# Patient Record
Sex: Male | Born: 1970 | Race: White | Hispanic: No | Marital: Married | Smoking: Never smoker
Health system: Southern US, Community
[De-identification: ages and names within clinical notes are randomized; demographics above are authoritative.]

## PROBLEM LIST (undated history)

## (undated) DIAGNOSIS — N200 Calculus of kidney: Secondary | ICD-10-CM

## (undated) DIAGNOSIS — R7989 Other specified abnormal findings of blood chemistry: Secondary | ICD-10-CM

## (undated) DIAGNOSIS — R911 Solitary pulmonary nodule: Secondary | ICD-10-CM

## (undated) HISTORY — DX: Solitary pulmonary nodule: R91.1

## (undated) HISTORY — DX: Calculus of kidney: N20.0

## (undated) HISTORY — DX: Other specified abnormal findings of blood chemistry: R79.89

## (undated) HISTORY — PX: OTHER SURGICAL HISTORY: SHX169

---

## 1999-04-01 ENCOUNTER — Emergency Department (HOSPITAL_COMMUNITY): Admission: EM | Admit: 1999-04-01 | Discharge: 1999-04-01 | Payer: Self-pay | Admitting: Emergency Medicine

## 1999-04-01 ENCOUNTER — Encounter: Payer: Self-pay | Admitting: Emergency Medicine

## 1999-04-06 ENCOUNTER — Emergency Department (HOSPITAL_COMMUNITY): Admission: EM | Admit: 1999-04-06 | Discharge: 1999-04-06 | Payer: Self-pay | Admitting: Emergency Medicine

## 1999-04-06 ENCOUNTER — Encounter: Payer: Self-pay | Admitting: Emergency Medicine

## 2004-07-15 ENCOUNTER — Encounter: Admission: RE | Admit: 2004-07-15 | Discharge: 2004-07-15 | Payer: Self-pay | Admitting: Family Medicine

## 2009-12-17 ENCOUNTER — Ambulatory Visit (HOSPITAL_BASED_OUTPATIENT_CLINIC_OR_DEPARTMENT_OTHER): Admission: RE | Admit: 2009-12-17 | Discharge: 2009-12-17 | Payer: Self-pay | Admitting: Family Medicine

## 2009-12-17 ENCOUNTER — Ambulatory Visit: Payer: Self-pay | Admitting: Radiology

## 2010-03-16 ENCOUNTER — Ambulatory Visit: Payer: Self-pay | Admitting: Radiology

## 2010-03-16 ENCOUNTER — Emergency Department (HOSPITAL_BASED_OUTPATIENT_CLINIC_OR_DEPARTMENT_OTHER): Admission: EM | Admit: 2010-03-16 | Discharge: 2010-03-16 | Payer: Self-pay | Admitting: Emergency Medicine

## 2010-10-07 ENCOUNTER — Emergency Department (HOSPITAL_BASED_OUTPATIENT_CLINIC_OR_DEPARTMENT_OTHER)
Admission: EM | Admit: 2010-10-07 | Discharge: 2010-10-07 | Disposition: A | Discharge status: Home / Self Care | Payer: Managed Care, Other (non HMO) | Attending: Emergency Medicine | Admitting: Emergency Medicine

## 2010-10-07 ENCOUNTER — Emergency Department (INDEPENDENT_AMBULATORY_CARE_PROVIDER_SITE_OTHER): Payer: Managed Care, Other (non HMO)

## 2010-10-07 DIAGNOSIS — K219 Gastro-esophageal reflux disease without esophagitis: Secondary | ICD-10-CM | POA: Insufficient documentation

## 2010-10-07 DIAGNOSIS — R109 Unspecified abdominal pain: Secondary | ICD-10-CM

## 2010-10-07 DIAGNOSIS — R55 Syncope and collapse: Secondary | ICD-10-CM | POA: Insufficient documentation

## 2010-10-07 DIAGNOSIS — J45909 Unspecified asthma, uncomplicated: Secondary | ICD-10-CM | POA: Insufficient documentation

## 2010-10-07 DIAGNOSIS — N23 Unspecified renal colic: Secondary | ICD-10-CM | POA: Insufficient documentation

## 2010-10-07 LAB — CBC
HCT: 42.4 % (ref 39.0–52.0)
Hemoglobin: 14.5 g/dL (ref 13.0–17.0)
MCH: 28.3 pg (ref 26.0–34.0)
MCHC: 34.2 g/dL (ref 30.0–36.0)
MCV: 82.7 fL (ref 78.0–100.0)
Platelets: 200 10*3/uL (ref 150–400)
RBC: 5.13 MIL/uL (ref 4.22–5.81)
RDW: 13 % (ref 11.5–15.5)
WBC: 8 K/uL (ref 4.0–10.5)

## 2010-10-07 LAB — DIFFERENTIAL
Basophils Absolute: 0 10*3/uL (ref 0.0–0.1)
Basophils Relative: 0 % (ref 0–1)
Eosinophils Absolute: 0.1 K/uL (ref 0.0–0.7)
Eosinophils Relative: 1 % (ref 0–5)
Lymphocytes Relative: 17 % (ref 12–46)
Lymphs Abs: 1.3 K/uL (ref 0.7–4.0)
Monocytes Absolute: 0.6 10*3/uL (ref 0.1–1.0)
Monocytes Relative: 8 % (ref 3–12)
Neutro Abs: 5.9 10*3/uL (ref 1.7–7.7)
Neutrophils Relative %: 74 % (ref 43–77)

## 2010-10-07 LAB — COMPREHENSIVE METABOLIC PANEL WITH GFR
AST: 25 U/L (ref 0–37)
Albumin: 4.4 g/dL (ref 3.5–5.2)
CO2: 26 meq/L (ref 19–32)
GFR calc Af Amer: >60 mL/min (ref >60)
GFR calc non Af Amer: >60 mL/min (ref >60)
Potassium: 4 meq/L (ref 3.5–5.1)
Sodium: 144 meq/L (ref 135–145)
Total Bilirubin: 0.7 mg/dL (ref 0.3–1.2)

## 2010-10-07 LAB — COMPREHENSIVE METABOLIC PANEL
ALT: 21 U/L (ref 0–53)
Alkaline Phosphatase: 81 U/L (ref 39–117)
BUN: 15 mg/dL (ref 6–23)
Calcium: 9.5 mg/dL (ref 8.4–10.5)
Chloride: 107 mEq/L (ref 96–112)
Creatinine, Ser: 0.9 mg/dL (ref 0.4–1.5)
Glucose, Bld: 98 mg/dL (ref 70–99)
Total Protein: 7.7 g/dL (ref 6.0–8.3)

## 2010-10-07 LAB — GLUCOSE, CAPILLARY: Glucose-Capillary: 111 mg/dL — ABNORMAL HIGH (ref 70–99)

## 2010-10-07 LAB — URINALYSIS, ROUTINE W REFLEX MICROSCOPIC
Bilirubin Urine: NEGATIVE
Ketones, ur: NEGATIVE mg/dL
Leukocytes, UA: NEGATIVE
Nitrite: NEGATIVE
Protein, ur: NEGATIVE mg/dL
Specific Gravity, Urine: 1.019 (ref 1.005–1.030)
Urine Glucose, Fasting: NEGATIVE mg/dL
Urobilinogen, UA: 0.2 mg/dL (ref 0.0–1.0)
pH: 6.5 (ref 5.0–8.0)

## 2010-10-07 LAB — LIPASE, BLOOD: Lipase: 37 U/L (ref 23–300)

## 2010-10-07 LAB — D-DIMER, QUANTITATIVE: D-Dimer, Quant: <0.22 ug{FEU}/mL (ref 0.00–0.48)

## 2010-10-07 LAB — URINE MICROSCOPIC-ADD ON

## 2010-10-23 LAB — URINALYSIS, ROUTINE W REFLEX MICROSCOPIC
Protein, ur: NEGATIVE mg/dL
Specific Gravity, Urine: 1.029 (ref 1.005–1.030)

## 2010-10-23 LAB — CBC
HCT: 47.1 % (ref 39.0–52.0)
MCH: 29.3 pg (ref 26.0–34.0)
MCHC: 33.6 g/dL (ref 30.0–36.0)
Platelets: 216 10*3/uL (ref 150–400)
RDW: 12.6 % (ref 11.5–15.5)
WBC: 7.5 10*3/uL (ref 4.0–10.5)

## 2010-10-23 LAB — URINE CULTURE
Colony Count: NO GROWTH
Culture  Setup Time: 201108081913
Culture: NO GROWTH

## 2010-10-23 LAB — DIFFERENTIAL
Basophils Relative: 0 % (ref 0–1)
Eosinophils Absolute: 0.2 10*3/uL (ref 0.0–0.7)
Eosinophils Relative: 2 % (ref 0–5)
Lymphs Abs: 2.3 10*3/uL (ref 0.7–4.0)
Neutro Abs: 4.5 10*3/uL (ref 1.7–7.7)

## 2010-10-23 LAB — BASIC METABOLIC PANEL
GFR calc Af Amer: >60 mL/min (ref >60)
GFR calc non Af Amer: >60 mL/min (ref >60)
Glucose, Bld: 138 mg/dL — ABNORMAL HIGH (ref 70–99)
Sodium: 143 mEq/L (ref 135–145)

## 2010-10-23 LAB — URINE MICROSCOPIC-ADD ON

## 2010-11-17 ENCOUNTER — Other Ambulatory Visit (HOSPITAL_COMMUNITY): Payer: Self-pay | Admitting: Family Medicine

## 2010-11-17 DIAGNOSIS — N200 Calculus of kidney: Secondary | ICD-10-CM

## 2010-11-18 ENCOUNTER — Ambulatory Visit (HOSPITAL_COMMUNITY)
Admission: RE | Admit: 2010-11-18 | Discharge: 2010-11-18 | Disposition: A | Discharge status: Home / Self Care | Payer: Managed Care, Other (non HMO) | Source: Ambulatory Visit | Attending: Family Medicine | Admitting: Family Medicine

## 2010-11-18 DIAGNOSIS — R109 Unspecified abdominal pain: Secondary | ICD-10-CM | POA: Insufficient documentation

## 2010-11-18 DIAGNOSIS — N200 Calculus of kidney: Secondary | ICD-10-CM

## 2010-11-18 DIAGNOSIS — N201 Calculus of ureter: Secondary | ICD-10-CM | POA: Insufficient documentation

## 2011-08-11 IMAGING — CR DG CHEST 2V
2 series · 2 of 2 positions shown · non-contrast
Comparison: None.

CLINICAL DATA: Left flank pain, syncope, asthma

CHEST - 2 VIEW

[w chest pa]
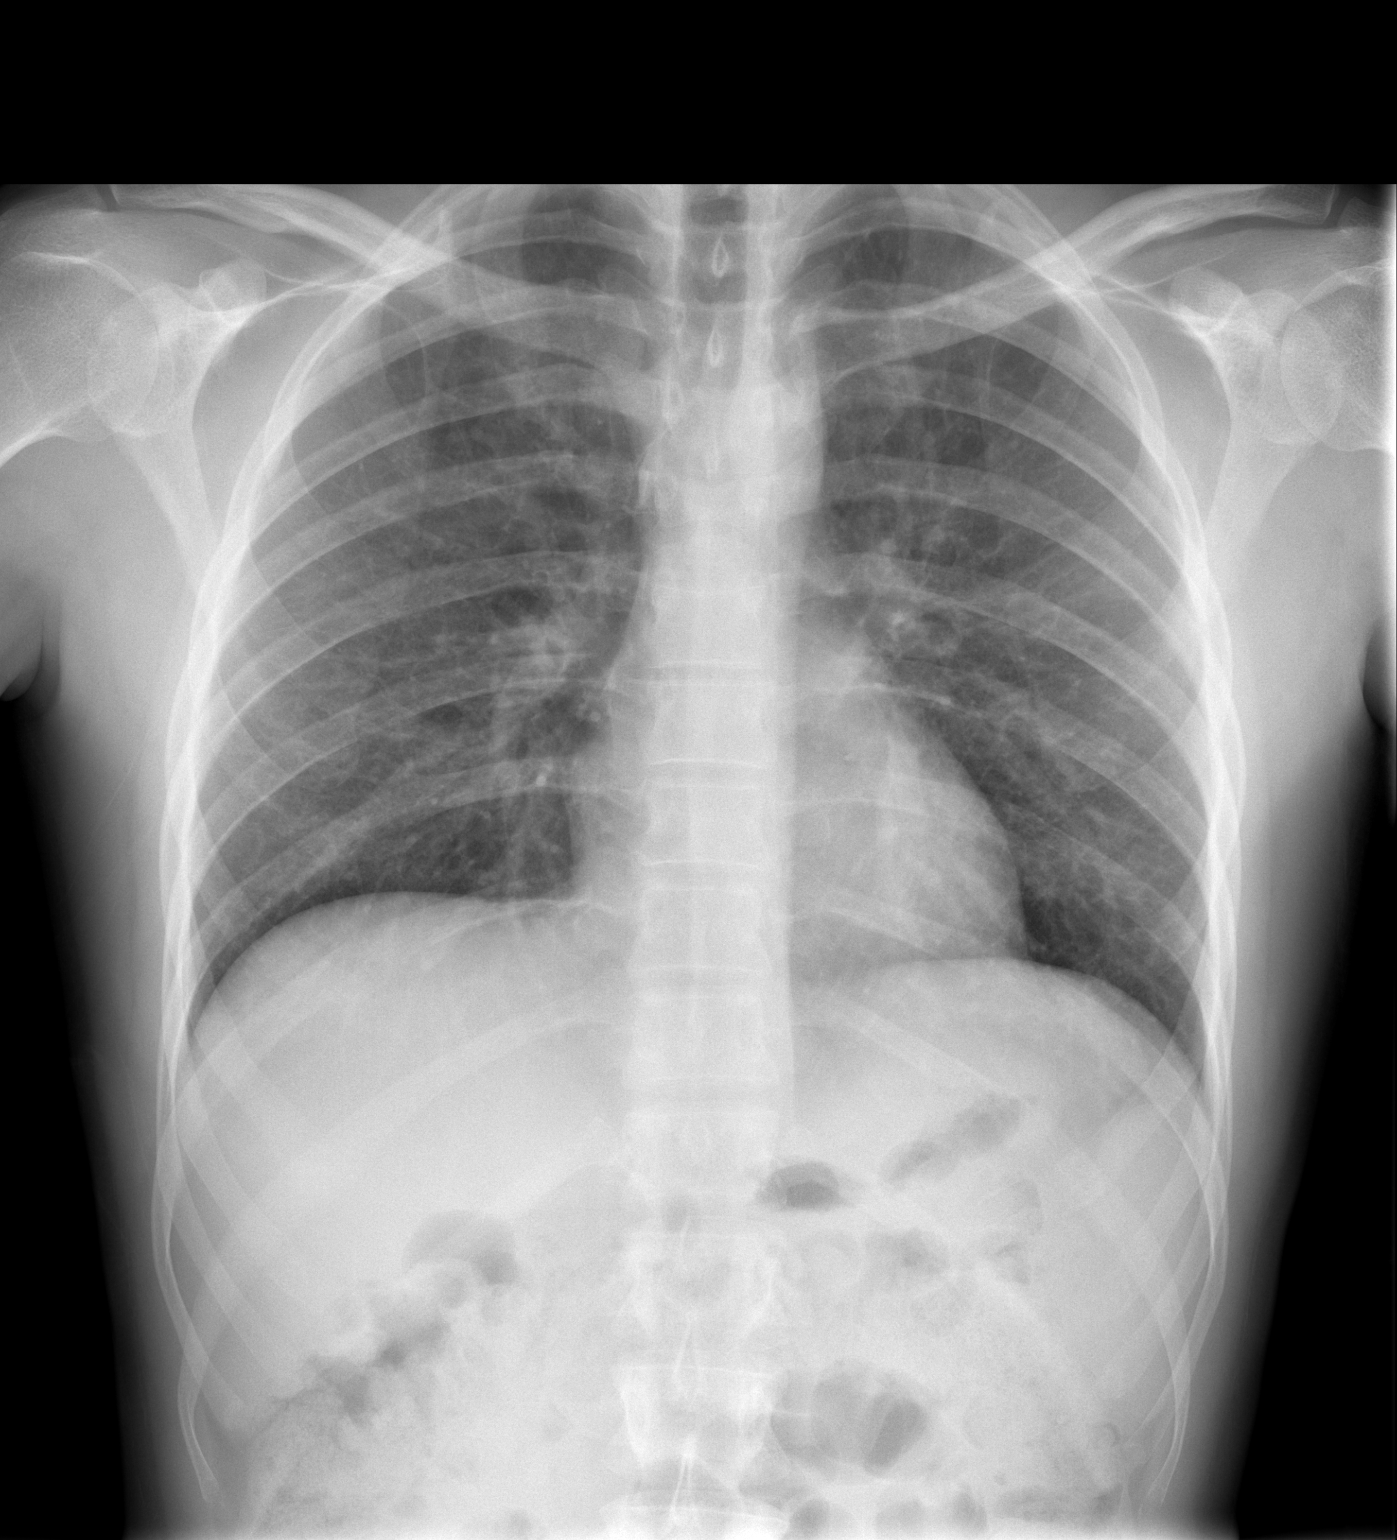

[w chest lat]
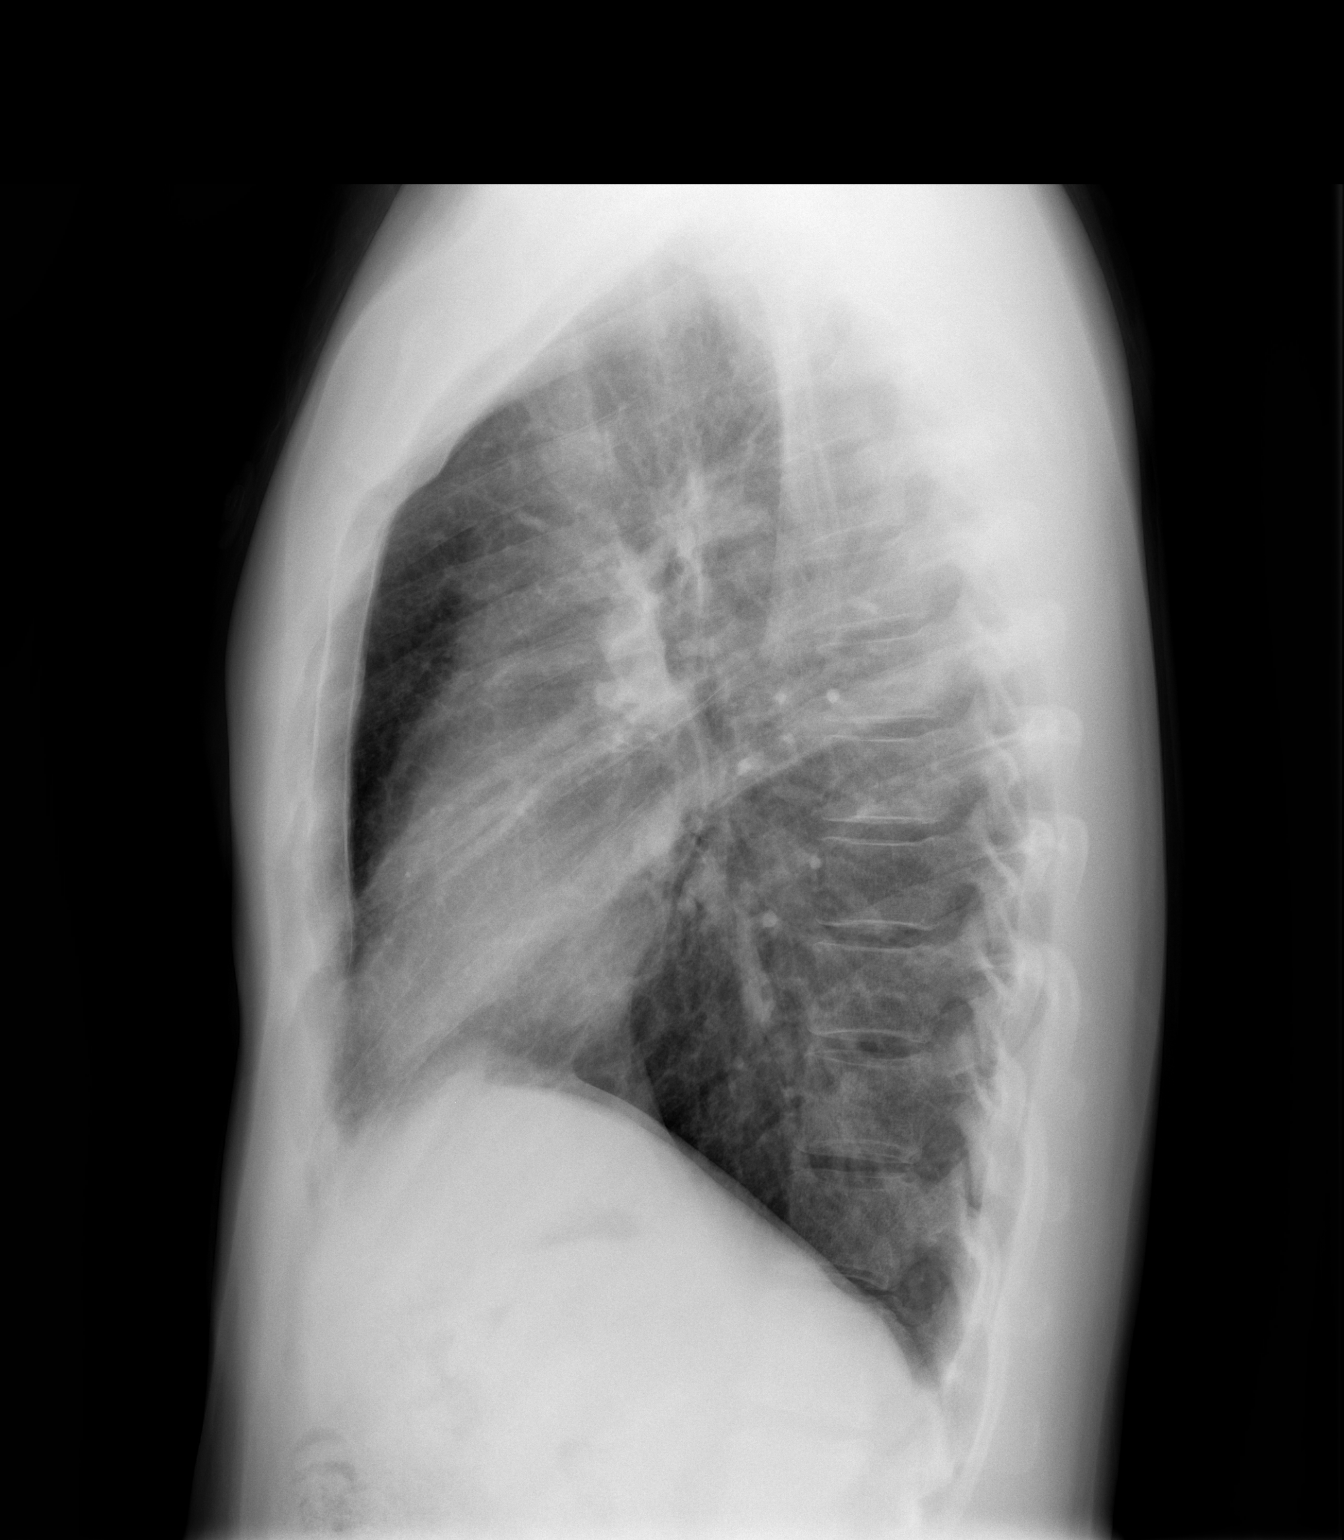

[2 of 2 positions shown; findings below may reference images not displayed]

FINDINGS: The lungs are clear.  Mediastinal contours appear normal.
The heart is within normal limits in size.  No bony abnormality is
seen.
IMPRESSION: No active lung disease.

## 2017-07-08 ENCOUNTER — Ambulatory Visit (HOSPITAL_COMMUNITY)
Admission: RE | Admit: 2017-07-08 | Discharge: 2017-07-08 | Disposition: A | Discharge status: Home / Self Care | Payer: 59 | Attending: Psychiatry | Admitting: Psychiatry

## 2017-07-08 DIAGNOSIS — E349 Endocrine disorder, unspecified: Secondary | ICD-10-CM | POA: Diagnosis not present

## 2017-07-08 DIAGNOSIS — F322 Major depressive disorder, single episode, severe without psychotic features: Secondary | ICD-10-CM | POA: Insufficient documentation

## 2017-07-08 DIAGNOSIS — Z5181 Encounter for therapeutic drug level monitoring: Secondary | ICD-10-CM | POA: Diagnosis not present

## 2017-07-08 DIAGNOSIS — Z13220 Encounter for screening for lipoid disorders: Secondary | ICD-10-CM | POA: Diagnosis not present

## 2017-07-08 DIAGNOSIS — Z130 Encounter for screening for diseases of the blood and blood-forming organs and certain disorders involving the immune mechanism: Secondary | ICD-10-CM | POA: Diagnosis not present

## 2017-07-08 NOTE — H&P (Signed)
Behavioral Health Medical Screening Exam  Caleb ArrowBryan S Cobb is an 46 y.o. male patient presents as walk-in with complaints of "feeling like I may be going through midlife crisis or depression."  Patient states that he has been having feeling of whether he needs to continue his marriage or if it would be better if he leaves.  Patient denies suicidal/homicidal/self-harm ideation, psychosis, and paranoia.  Patient seeking information and resources.     Total Time spent with patient: 45 minutes  Psychiatric Specialty Exam: Physical Exam  ROS  There were no vitals taken for this visit.There is no height or weight on file to calculate BMI.  General Appearance: Casual and Neat  Eye Contact:  Good  Speech:  Clear and Coherent and Normal Rate  Volume:  Normal  Mood:  Anxious  Affect:  Appropriate  Thought Process:  Coherent and Goal Directed  Orientation:  Full (Time, Place, and Person)  Thought Content:  Logical  Suicidal Thoughts:  No  Homicidal Thoughts:  No  Memory:  Immediate;   Good Recent;   Good Remote;   Good  Judgement:  Intact  Insight:  Present  Psychomotor Activity:  Normal  Concentration: Concentration: Good and Attention Span: Good  Recall:  Good  Fund of Knowledge:Good  Language: Good  Akathisia:  No  Handed:  Right  AIMS (if indicated):     Assets:  Communication Skills Desire for Improvement Financial Resources/Insurance Housing Intimacy Physical Health Resilience Social Support Transportation  Sleep:       Musculoskeletal: Strength & Muscle Tone: within normal limits Gait & Station: normal Patient leans: N/A  There were no vitals taken for this visit.  Recommendations:  Give resource information for marriage and individual counseling.    Disposition: No evidence of imminent risk to self or others at present.   Patient does not meet criteria for psychiatric inpatient admission.     Based on my evaluation the patient does not appear to have an  emergency medical condition.  Saddie Sandeen, NP 07/08/2017, 3:19 PM

## 2017-07-08 NOTE — BH Assessment (Signed)
Assessment Note  Caleb Cobb is an 46 y.o. male present unaccompanied to [facility] as a walk-in referred by his doctor office ([facility])  after a scheduled appointment earlier today. Patient report since the end of September his depression has been gradually increasing. Patient state, "I feel like I am going through a mid-life crisis or situational depression." Report depressive symptoms such as decreased sleep, decreased appetite, confusion, inability to think logically, clouded judgment and negative intrusive passive thoughts. Report he has been married for 23 years. Throughout his marriage has been dealing with situations  which have recently started to make him depressed. "I used to be able to handle situations and know what to do, recently I am confused and do not know how to handle things." Patient did not go into detail concerning his marital complications, only reporting he has a jealous wife. Patient report inductiveness relating to marriage. Went to the doctor for a prescription for an anti-depressant. Report, "I feel if I can think clearer then I will be able to make better decisions." Patient was referred to [facility] for an assessment.   Patient denies active suicidal ideations. Report he would never kill him self, however, he has had thoughts that his wife would be better off without him either through divorce or if he was dead.  Denies homicidal ideations and denies auditory/visual hallucinations. Denies access to weapons, substance use, legal complications. Denies history of mental health, medication management, inpatient or outpatient therapy services. Denies history of abuse.   Patient present with a flat affect, provided good eye contact, and spoke with logical speech and tone. Patient judgement and though process pre-occurred with indecisiveness and confusion. Patient dressed appropriately for the weather. Patient oriented x4.   Diagnosis:  F32.2    Major  depressive disorder, Single episode, Severe  Disposition:  Per Shuvon Rankin, NP, patient recommend for discharge with outpatient resources for individual and marriage counseling   Past Medical History: No past medical history on file.   Family History: No family history on file.  Social History:  has no tobacco, alcohol, and drug history on file.  Additional Social History:  Alcohol / Drug Use Pain Medications: see MAR Prescriptions: see MAR Over the Counter: see MAR History of alcohol / drug use?: No history of alcohol / drug abuse  CIWA: CIWA-Ar BP: 134/78 Pulse Rate: 82 COWS:    Allergies: Allergies not on file  Home Medications:  (Not in a hospital admission)  OB/GYN Status:  No LMP for male patient.  General Assessment Data Location of Assessment: [facility] Assessment Services TTS Assessment: In system Is this a Tele or Face-to-Face Assessment?: Face-to-Face Is this an Initial Assessment or a Re-assessment for this encounter?: Initial Assessment Marital status: Married [place] name: n/a Is patient pregnant?: No Pregnancy Status: No Living Arrangements: Spouse/significant other(lives with wife, one child remains in the home) Can pt return to current living arrangement?: Yes Admission Status: Voluntary Is patient capable of signing voluntary admission?: Yes Referral Source: Self/Family/Friend(referred to seek evaluation after visiting doctor today) Insurance type: [place] [employer]  Medical Screening Exam [facility] Walk-in ONLY) Medical Exam completed: Yes  Crisis Care Plan Living Arrangements: Spouse/significant other(lives with wife, one child remains in the home) Name of Psychiatrist: none report Name of Therapist: none report  Education Status Is patient currently in school?: No Highest grade of school patient has completed: 11th grade  Risk to self with the past 6 months Suicidal Ideation: No Has patient been a risk  to self within the past 6 months prior to  admission? : No Suicidal Intent: No Has patient had any suicidal intent within the past 6 months prior to admission? : No Is patient at risk for suicide?: No Suicidal Plan?: No Has patient had any suicidal plan within the past 6 months prior to admission? : No Access to Means: No What has been your use of drugs/alcohol within the last 12 months?: none report Previous Attempts/Gestures: No Triggers for Past Attempts: None known Intentional Self Injurious Behavior: None Family Suicide History: No Recent stressful life event(s): (marital problems) Persecutory voices/beliefs?: No Depression: Yes Depression Symptoms: Insomnia, Guilt(confusion, ) Substance abuse history and/or treatment for substance abuse?: No Suicide prevention information given to non-admitted patients: Not applicable  Risk to Others within the past 6 months Homicidal Ideation: No Does patient have any lifetime risk of violence toward others beyond the six months prior to admission? : No Thoughts of Harm to Others: No Current Homicidal Intent: No Current Homicidal Plan: No Access to Homicidal Means: No Identified Victim: n/a History of harm to others?: No Assessment of Violence: None Noted Violent Behavior Description: none report Does patient have access to weapons?: No Criminal Charges Pending?: No Does patient have a court date: No Is patient on probation?: No  Psychosis Hallucinations: None noted Delusions: None noted  Mental Status Report Appearance/Hygiene: Unremarkable(dressed causal) Eye Contact: Good Motor Activity: Freedom of movement Speech: Logical/coherent Level of Consciousness: Alert Mood: Depressed, Preoccupied Affect: Depressed, Preoccupied Anxiety Level: Minimal Thought Processes: Circumstantial, Coherent Judgement: Unimpaired Orientation: Person, Place, Time, Situation Obsessive Compulsive Thoughts/Behaviors: None  Cognitive Functioning Concentration: Good Memory: Recent Intact,  Remote Intact IQ: Average Insight: Good Impulse Control: Good Appetite: Poor(decreased) Weight Loss: 9(unwanted weight loss of 9 pds in 2 months) Sleep: Decreased Vegetative Symptoms: None  ADLScreening [facility] Assessment Services) Patient's cognitive ability adequate to safely complete daily activities?: Yes Patient able to express need for assistance with ADLs?: Yes Independently performs ADLs?: Yes (appropriate for developmental age)  Prior Inpatient Therapy Prior Inpatient Therapy: No  Prior Outpatient Therapy Prior Outpatient Therapy: No Does patient have an ACCT team?: No Does patient have Intensive In-House Services?  : No Does patient have [facility] services? : No Does patient have P4CC services?: No  ADL Screening (condition at time of admission) Patient's cognitive ability adequate to safely complete daily activities?: Yes Is the patient deaf or have difficulty hearing?: No Does the patient have difficulty seeing, even when wearing glasses/contacts?: No Does the patient have difficulty concentrating, remembering, or making decisions?: No Patient able to express need for assistance with ADLs?: Yes Does the patient have difficulty dressing or bathing?: No Independently performs ADLs?: Yes (appropriate for developmental age) Does the patient have difficulty walking or climbing stairs?: No Weakness of Legs: None Weakness of Arms/Hands: None       Abuse/Neglect Assessment (Assessment to be complete while patient is alone) Abuse/Neglect Assessment Can Be Completed: Yes Physical Abuse: Denies Verbal Abuse: Denies Sexual Abuse: Denies Exploitation of patient/patient's resources: Denies Self-Neglect: Denies     Merchant navy officerAdvance Directives (For Healthcare) Does Patient Have a Medical Advance Directive?: No Would patient like information on creating a medical advance directive?: No - Patient declined    Additional Information 1:1 In Past 12 Months?: No CIRT Risk: No Elopement  Risk: No Does patient have medical clearance?: No     Disposition:  Per Shuvon Rankin, NP, patient recommend for discharge with outpatient resources for individual and marriage counseling Disposition Initial Assessment Completed for  this Encounter: Yes Disposition of Patient: Outpatient treatment, Discharge with Outpatient Resources Type of outpatient treatment: Adult(Individual and marriage counseling)  On Site Evaluation by:   Reviewed with Physician:    Dian Situelvondria Ruven Corradi 07/08/2017 3:30 PM

## 2017-11-14 ENCOUNTER — Ambulatory Visit (INDEPENDENT_AMBULATORY_CARE_PROVIDER_SITE_OTHER): Payer: 59

## 2017-11-14 ENCOUNTER — Other Ambulatory Visit: Payer: Self-pay | Admitting: Family Medicine

## 2017-11-14 ENCOUNTER — Ambulatory Visit: Payer: 59 | Admitting: Family Medicine

## 2017-11-14 ENCOUNTER — Encounter: Payer: Self-pay | Admitting: Family Medicine

## 2017-11-14 VITALS — BP 142/76 | HR 82 | Temp 98.6°F | Ht 70.0 in | Wt 189.6 lb

## 2017-11-14 DIAGNOSIS — Z23 Encounter for immunization: Secondary | ICD-10-CM

## 2017-11-14 DIAGNOSIS — R109 Unspecified abdominal pain: Secondary | ICD-10-CM | POA: Diagnosis not present

## 2017-11-14 DIAGNOSIS — R058 Other specified cough: Secondary | ICD-10-CM

## 2017-11-14 DIAGNOSIS — R05 Cough: Secondary | ICD-10-CM

## 2017-11-14 MED ORDER — MONTELUKAST SODIUM 10 MG PO TABS: 10.0000 mg | ORAL_TABLET | Freq: Every day | ORAL | 3 refills | Status: AC

## 2017-11-14 MED ORDER — ALBUTEROL SULFATE HFA 108 (90 BASE) MCG/ACT IN AERS: 2.0000 | INHALATION_SPRAY | Freq: Four times a day (QID) | RESPIRATORY_TRACT | 0 refills | Status: DC | PRN

## 2017-11-14 NOTE — Patient Instructions (Addendum)
Great to meet you.  Please take singulair daily and use pro air inhaler as needed for exercise.  I will call you with your xray results.

## 2017-11-14 NOTE — Assessment & Plan Note (Signed)
Discussed with pt and wife. They would prefer to hold off on spirometry/pulm referral at this time. Start singulair daily, proair prior to exercise. CXR today. The patient indicates understanding of these issues and agrees with the plan.

## 2017-11-14 NOTE — Progress Notes (Signed)
Subjective:   Patient ID: Caleb Cobb, male    DOB: 01/21/1971, 47 y.o.   MRN: [id number]  Caleb ArrowBryan S Mancias is a pleasant 47 y.o. year old male who presents to clinic today with Cough (cough when exercising and in hot showers.)  on 11/14/2017  HPI:  Cough while exercising- has noticed coughing spells during exercise for past 2-3 months (since he and his wife joined a gym).  Not really short of breath or feels like he is wheezing when it happens.  He is allergic to pollen and there has been a lot of pollen outside. But he is mainly exercising inside.  No dizziness or chest pain when this occurs.  Symptoms resolve immediately when he stops exercising.  He is not a smoker.  He is exposed to several inhalents at work.  "Someteimes when I blow my nose, what I blow out is black."  Current Outpatient Medications on File Prior to Visit  Medication Sig Dispense Refill  . FLUoxetine (PROZAC) 20 MG tablet Take by mouth.    . Multiple Vitamin (THERA) TABS Take by mouth.    . testosterone cypionate (DEPOTESTOSTERONE CYPIONATE) 200 MG/ML injection INJECT 1 ML (200 MG TOTAL) INTO THE MUSCLE EVERY 14 (FOURTEEN) DAYS.  1   No current facility-administered medications on file prior to visit.     Allergies  Allergen Reactions  . Demerol  [Meperidine Hcl]     Past Medical History:  Diagnosis Date  . Calcium oxalate dihydrate kidney stones   . Low testosterone   . Lung nodule      No family history on file.  Social History   Socioeconomic History  . Marital status: Married    Spouse name: Not on file  . Number of children: Not on file  . Years of education: Not on file  . Highest education level: Not on file  Occupational History  . Not on file  Social Needs  . Financial resource strain: Not on file  . Food insecurity:    Worry: Not on file    Inability: Not on file  . Transportation needs:    Medical: Not on file    Non-medical: Not on file  Tobacco Use  . Smoking  status: Never Smoker  . Smokeless tobacco: Never Used  Substance and Sexual Activity  . Alcohol use: Yes    Comment: seldom  . Drug use: Never  . Sexual activity: Not on file  Lifestyle  . Physical activity:    Days per week: Not on file    Minutes per session: Not on file  . Stress: Not on file  Relationships  . Social connections:    Talks on phone: Not on file    Gets together: Not on file    Attends religious service: Not on file    Active member of club or organization: Not on file    Attends meetings of clubs or organizations: Not on file    Relationship status: Not on file  . Intimate partner violence:    Fear of current or ex partner: Not on file    Emotionally abused: Not on file    Physically abused: Not on file    Forced sexual activity: Not on file  Other Topics Concern  . Not on file  Social History Narrative  . Not on file   The PMH, PSH, Social History, Family History, Medications, and allergies [facility], and have been updated if relevant.   Review  of Systems  Constitutional: Negative.   HENT: Negative.   Eyes: Negative.   Respiratory: Positive for cough. Negative for apnea, choking, chest tightness, shortness of breath, wheezing and stridor.   Endocrine: Negative.   Genitourinary: Negative.   Musculoskeletal: Negative.   Allergic/Immunologic: Positive for environmental allergies.  Neurological: Negative.   Hematological: Negative.   Psychiatric/Behavioral: Negative.   All other systems reviewed and are negative.      Objective:    BP (!) 142/76 (BP Location: Left Arm, Patient Position: Sitting, Cuff Size: Normal)   Pulse 82   Temp 98.6 F (37 C) (Oral)   Ht 5\' 10"  (1.778 m)   Wt 189 lb 9.6 oz (86 kg)   SpO2 95%   BMI 27.20 kg/m    Physical Exam  General:  pleasant male in no acute distress Eyes:  PERRL Ears:  External ear exam shows no significant lesions or deformities.  TMs normal bilaterally Hearing is grossly  normal bilaterally. Nose:  External nasal examination shows no deformity or inflammation. Nasal mucosa are pink and moist without lesions or exudates. Mouth:  Oral mucosa and oropharynx without lesions or exudates.  Teeth in good repair. Neck:  no carotid bruit or thyromegaly no cervical or supraclavicular lymphadenopathy  Lungs:  Normal respiratory effort, chest expands symmetrically. Lungs are clear to auscultation, no crackles or wheezes. Heart:  Normal rate and regular rhythm. S1 and S2 normal without gallop, murmur, click, rub or other extra sounds. Extremities:  no edema  Psych:  Good eye contact, not anxious or depressed appearing       Assessment & Plan:   Cough on exercise - Plan: DG Chest 2 View  Need for diphtheria-tetanus-pertussis (Tdap) vaccine - Plan: Tdap vaccine greater than or equal to 7yo IM No follow-ups on file.

## 2017-12-11 ENCOUNTER — Other Ambulatory Visit: Payer: Self-pay | Admitting: Family Medicine

## 2018-02-06 ENCOUNTER — Telehealth: Payer: Self-pay | Admitting: Family Medicine

## 2018-02-06 DIAGNOSIS — R1030 Lower abdominal pain, unspecified: Secondary | ICD-10-CM

## 2018-02-06 NOTE — Telephone Encounter (Signed)
Spoke with patient's wife.  Has been in severe pain for several days, similar to his previous kidney stone pain.  He has had numerous kidney stones in the past.  Concern for obstruction/hydronephorosis.  Will order stat renal CT and he can follow up with me after we receive those results.

## 2018-02-08 ENCOUNTER — Other Ambulatory Visit: Payer: Self-pay | Admitting: Family Medicine

## 2018-02-08 ENCOUNTER — Ambulatory Visit (HOSPITAL_BASED_OUTPATIENT_CLINIC_OR_DEPARTMENT_OTHER)
Admission: RE | Admit: 2018-02-08 | Discharge: 2018-02-08 | Disposition: A | Discharge status: Home / Self Care | Payer: 59 | Source: Ambulatory Visit | Attending: Family Medicine | Admitting: Family Medicine

## 2018-02-08 DIAGNOSIS — R7989 Other specified abnormal findings of blood chemistry: Secondary | ICD-10-CM

## 2018-02-08 DIAGNOSIS — N200 Calculus of kidney: Secondary | ICD-10-CM | POA: Diagnosis not present

## 2018-02-08 DIAGNOSIS — K4090 Unilateral inguinal hernia, without obstruction or gangrene, not specified as recurrent: Secondary | ICD-10-CM | POA: Diagnosis not present

## 2018-02-08 DIAGNOSIS — R109 Unspecified abdominal pain: Secondary | ICD-10-CM | POA: Diagnosis not present

## 2018-02-08 DIAGNOSIS — R1030 Lower abdominal pain, unspecified: Secondary | ICD-10-CM | POA: Diagnosis not present

## 2018-02-08 MED ORDER — TRAMADOL HCL 50 MG PO TABS: 50.0000 mg | ORAL_TABLET | Freq: Three times a day (TID) | ORAL | 0 refills | Status: DC | PRN

## 2018-02-08 MED ORDER — TAMSULOSIN HCL 0.4 MG PO CAPS: ORAL_CAPSULE | ORAL | 0 refills | Status: AC

## 2018-02-10 ENCOUNTER — Encounter: Payer: Self-pay | Admitting: Family Medicine

## 2018-02-13 ENCOUNTER — Ambulatory Visit: Payer: 59 | Admitting: Family Medicine

## 2018-02-13 DIAGNOSIS — R109 Unspecified abdominal pain: Secondary | ICD-10-CM | POA: Diagnosis not present

## 2018-02-13 DIAGNOSIS — R7989 Other specified abnormal findings of blood chemistry: Secondary | ICD-10-CM | POA: Diagnosis not present

## 2018-02-13 DIAGNOSIS — N200 Calculus of kidney: Secondary | ICD-10-CM

## 2018-02-13 LAB — COMPREHENSIVE METABOLIC PANEL
ALT: 17 U/L (ref 0–53)
AST: 19 U/L (ref 0–37)
Albumin: 4.2 g/dL (ref 3.5–5.2)
Alkaline Phosphatase: 62 U/L (ref 39–117)
BILIRUBIN TOTAL: 0.6 mg/dL (ref 0.2–1.2)
BUN: 16 mg/dL (ref 6–23)
CALCIUM: 9.4 mg/dL (ref 8.4–10.5)
CO2: 29 mEq/L (ref 19–32)
CREATININE: 1.18 mg/dL (ref 0.40–1.50)
Chloride: 104 mEq/L (ref 96–112)
GFR: 71.06 mL/min (ref >60.00)
Glucose, Bld: 89 mg/dL (ref 70–99)
Potassium: 4.5 mEq/L (ref 3.5–5.1)
Sodium: 138 mEq/L (ref 135–145)
Total Protein: 6.5 g/dL (ref 6.0–8.3)

## 2018-02-13 LAB — CBC
HCT: 50.1 % (ref 39.0–52.0)
Hemoglobin: 17 g/dL (ref 13.0–17.0)
MCHC: 34 g/dL (ref 30.0–36.0)
MCV: 86.8 fl (ref 78.0–100.0)
Platelets: 210 10*3/uL (ref 150.0–400.0)
RBC: 5.77 Mil/uL (ref 4.22–5.81)
RDW: 14.2 % (ref 11.5–15.5)
WBC: 5.5 10*3/uL (ref 4.0–10.5)

## 2018-02-13 LAB — PSA: PSA: 0.55 ng/mL (ref 0.10–4.00)

## 2018-02-13 LAB — TESTOSTERONE: Testosterone: 301.06 ng/dL (ref 300.00–890.00)

## 2018-02-13 MED ORDER — HYDROCODONE-ACETAMINOPHEN 5-325 MG PO TABS: 1.0000 | ORAL_TABLET | Freq: Four times a day (QID) | ORAL | 0 refills | Status: AC | PRN

## 2018-02-13 MED ORDER — TESTOSTERONE 50 MG/5GM (1%) TD GEL: 5.0000 g | Freq: Every day | TRANSDERMAL | 3 refills | Status: DC

## 2018-02-13 NOTE — Patient Instructions (Signed)
Start Testim 5 mg daily. Please keep me updated. Follow up 6 months.

## 2018-02-13 NOTE — Progress Notes (Signed)
Subjective:   Patient ID: Caleb Cobb, male    DOB: 07/28/1971, 47 y.o.   MRN: [id number]  Caleb Cobb is a pleasant 47 y.o. year old male who presents to clinic today with Follow-up  on 02/13/2018  HPI:  Low testosterone-  Start injections about a year ago.  Has not feel it has helped much- maybe when he initially receives the injection for a few days, he feels less tired. Currently taking 200 mg IM every two weeks.  Lab Results  Component Value Date   TESTOSTERONE 301.06 02/13/2018   Lab Results  Component Value Date   PSA 0.55 02/13/2018   Lab Results  Component Value Date   WBC 5.5 02/13/2018   HGB 17.0 02/13/2018   HCT 50.1 02/13/2018   MCV 86.8 02/13/2018   PLT 210.0 02/13/2018     Feels his back pain is improving- CT did confirm kidney stones but non obstructing and without hydronephrosis. He is asking for rx to have at home for norco if he does get another kidney stone.    Ct Renal Stone Study  Result Date: 02/08/2018 CLINICAL DATA:  LEFT flank pain for a few weeks worse since Saturday, history kidney stones EXAM: CT ABDOMEN AND PELVIS WITHOUT CONTRAST TECHNIQUE: Multidetector CT imaging of the abdomen and pelvis was performed following the standard protocol without IV contrast. Sagittal and coronal MPR images reconstructed from axial data set. Oral contrast not administered for this indication. COMPARISON:  11/18/2010 FINDINGS: Lower chest: Lung bases clear Hepatobiliary: Gallbladder and liver normal appearance Pancreas: Normal appearance Spleen: Normal appearance Adrenals/Urinary Tract: Adrenal glands normal appearance. Tiny nonobstructing calculus at inferior pole RIGHT kidney. No additional urinary tract calcification or dilatation. Kidneys, ureters, and contracted bladder otherwise normal appearance. Tiny distal LEFT ureteral calculus at ureterovesical junction seen on previous exam no longer identified. Stomach/Bowel: Appendix not visualized but no  pericecal inflammatory process is seen. Scattered stool throughout colon. Stomach distended by food debris. Small bowel loops unremarkable. Question mild wall thickening of rectum versus artifact from incomplete distention, similar to prior exam. Vascular/Lymphatic: Aorta normal caliber. Few normal sized mesenteric lymph nodes without adenopathy. Reproductive: Mild prostatic enlargement, gland 4.0 x 3.3 cm. Other: Small RIGHT inguinal hernia containing fat. No free air or free fluid. Musculoskeletal: No acute osseous findings. IMPRESSION: Tiny nonobstructing RIGHT renal calculus. Small RIGHT inguinal hernia containing fat. Question mild chronic rectal wall thickening versus artifact from underdistention. No definite acute intra-abdominal or intrapelvic abnormalities. Electronically Signed   By: Ulyses SouthwardMark  Boles M.D.   On: 02/08/2018 17:28     Current Outpatient Medications on File Prior to Visit  Medication Sig Dispense Refill  . albuterol (PROVENTIL HFA;VENTOLIN HFA) 108 (90 Base) MCG/ACT inhaler TAKE 2 PUFFS BY MOUTH EVERY 6 HOURS AS NEEDED 8.5 Inhaler 0  . FLUoxetine (PROZAC) 20 MG tablet Take by mouth.    . montelukast (SINGULAIR) 10 MG tablet Take 1 tablet (10 mg total) by mouth at bedtime. 30 tablet 3  . Multiple Vitamin (THERA) TABS Take by mouth.    . tamsulosin (FLOMAX) 0.4 MG CAPS capsule 0.4 mg once daily until stone passage occurs or for up to 4 weeks 30 capsule 0  . testosterone cypionate (DEPOTESTOSTERONE CYPIONATE) 200 MG/ML injection INJECT 1 ML (200 MG TOTAL) INTO THE MUSCLE EVERY 14 (FOURTEEN) DAYS.  1  . traMADol (ULTRAM) 50 MG tablet Take 1 tablet (50 mg total) by mouth every 8 (eight) hours as needed. 30 tablet 0   No current  facility-administered medications on file prior to visit.     Allergies  Allergen Reactions  . Demerol  [Meperidine Hcl]     Past Medical History:  Diagnosis Date  . Calcium oxalate dihydrate kidney stones   . Low testosterone   . Lung nodule       No family history on file.  Social History   Socioeconomic History  . Marital status: Married    Spouse name: Not on file  . Number of children: Not on file  . Years of education: Not on file  . Highest education level: Not on file  Occupational History  . Not on file  Social Needs  . Financial resource strain: Not on file  . Food insecurity:    Worry: Not on file    Inability: Not on file  . Transportation needs:    Medical: Not on file    Non-medical: Not on file  Tobacco Use  . Smoking status: Never Smoker  . Smokeless tobacco: Never Used  Substance and Sexual Activity  . Alcohol use: Yes    Comment: seldom  . Drug use: Never  . Sexual activity: Not on file  Lifestyle  . Physical activity:    Days per week: Not on file    Minutes per session: Not on file  . Stress: Not on file  Relationships  . Social connections:    Talks on phone: Not on file    Gets together: Not on file    Attends religious service: Not on file    Active member of club or organization: Not on file    Attends meetings of clubs or organizations: Not on file    Relationship status: Not on file  . Intimate partner violence:    Fear of current or ex partner: Not on file    Emotionally abused: Not on file    Physically abused: Not on file    Forced sexual activity: Not on file  Other Topics Concern  . Not on file  Social History Narrative  . Not on file   The PMH, PSH, Social History, Family History, Medications, and allergies have been reviewed in [facility], and have been updated if relevant.   Review of Systems  Constitutional: Positive for fatigue.  HENT: Negative.   Eyes: Negative.   Respiratory: Negative.   Cardiovascular: Negative.   Gastrointestinal: Negative.   Endocrine: Negative.   Genitourinary: Positive for flank pain. Negative for frequency and hematuria.  Musculoskeletal: Positive for back pain.  Allergic/Immunologic: Negative.   Neurological: Negative.    Hematological: Negative.   Psychiatric/Behavioral: Negative.   All other systems reviewed and are negative.      Objective:    BP 126/86   Pulse 93   Temp 97.7 F (36.5 C)   Ht 5\' 10"  (1.778 m)   Wt 184 lb 3.2 oz (83.6 kg)   BMI 26.43 kg/m    Physical Exam  General:  pleasant male in no acute distress Eyes:  PERRL Ears:  External ear exam shows no significant lesions or deformities.  TMs normal bilaterally Hearing is grossly normal bilaterally. Nose:  External nasal examination shows no deformity or inflammation. Nasal mucosa are pink and moist without lesions or exudates. Mouth:  Oral mucosa and oropharynx without lesions or exudates.  Teeth in good repair. Neck:  no carotid bruit or thyromegaly no cervical or supraclavicular lymphadenopathy  Lungs:  Normal respiratory effort, chest expands symmetrically. Lungs are clear to auscultation, no crackles or wheezes. Heart:  Normal rate and regular rhythm. S1 and S2 normal without gallop, murmur, click, rub or other extra sounds. Abdomen:  Bowel sounds positive,abdomen soft and non-tender without masses, organomegaly or hernias noted. Pulses:  R and L posterior tibial pulses are full and equal bilaterally  Extremities:  no edema  Psych:  Good eye contact, not anxious or depressed appearing       Assessment & Plan:   Low testosterone - Plan: Comprehensive metabolic panel, CBC, PSA, Testosterone, free, Testosterone  Kidney stones No follow-ups on file.

## 2018-02-14 DIAGNOSIS — R109 Unspecified abdominal pain: Secondary | ICD-10-CM | POA: Insufficient documentation

## 2018-02-14 DIAGNOSIS — R10A2 Flank pain, left side: Secondary | ICD-10-CM | POA: Insufficient documentation

## 2018-02-14 NOTE — Assessment & Plan Note (Signed)
Has stones remaining, none obstructing. He did take flomax as directed.

## 2018-02-14 NOTE — Assessment & Plan Note (Signed)
Discussed tx with pt and his wife at length. IM testosterone tends to cause more peaks and valleys in terms of testosterone levels than topical testosterone.  Since he is already on a significant dose of IM testosterone without symptom improvement, I advised either urology referral or trying topical testosterone.  He would like to try topical- rx prescribed today. Dc IM testosterone.  Follow up in 6 months. The patient indicates understanding of these issues and agrees with the plan.

## 2018-02-14 NOTE — Assessment & Plan Note (Signed)
Resolving- likely passed a kidney stone. Rx printed for short supply of norco to have at home in case he passes remaining stones seen on CT.

## 2018-02-15 LAB — TESTOSTERONE, FREE: TESTOSTERONE FREE: 60 pg/mL (ref 46.0–224.0)

## 2018-02-17 ENCOUNTER — Other Ambulatory Visit: Payer: Self-pay | Admitting: Family Medicine

## 2018-02-17 MED ORDER — TESTOSTERONE 50 MG/5GM (1%) TD GEL: 5.0000 g | Freq: Every day | TRANSDERMAL | 3 refills | Status: DC

## 2018-02-23 ENCOUNTER — Telehealth: Payer: Self-pay | Admitting: Family Medicine

## 2018-02-23 DIAGNOSIS — R7989 Other specified abnormal findings of blood chemistry: Secondary | ICD-10-CM

## 2018-02-23 MED ORDER — TESTOSTERONE CYPIONATE 200 MG/ML IM SOLN: 200.0000 mg | INTRAMUSCULAR | 0 refills | Status: DC

## 2018-02-23 NOTE — Telephone Encounter (Signed)
Spouse is inform of message below.   Spouse request Dr. Dayton MartesAron send in testosterone 200 mg/431ml multidose [facility]ail [facility] [place]. They are out of this. Please help

## 2018-02-23 NOTE — Telephone Encounter (Signed)
No, Testim was the only one they will cover but this med has been discontinue per pharmacy. Please advise.

## 2018-02-23 NOTE — Telephone Encounter (Signed)
I am going to refer her to urology because I am not comfortable with the non topical alternatives.  Please let wife know to continue injections until we can get him into see a urologist and explain to her what happened with PA.  Thank you.

## 2018-02-23 NOTE — Telephone Encounter (Signed)
They wont cover any topical preparations?

## 2018-02-23 NOTE — Telephone Encounter (Signed)
Okay to send rx as requested.

## 2018-02-23 NOTE — Telephone Encounter (Signed)
Spoke with [facility]. She said that we can try Striant( testosterone buccal ) 30 mg limit of 2 tablet per day (this will need a PA). Or we can try Testosterone Bulk powder (no PA needed). Please advise.

## 2018-02-23 NOTE — Telephone Encounter (Signed)
Rx sent to [facility]

## 2018-02-23 NOTE — Telephone Encounter (Signed)
Copied from CRM [id number]. Topic: Quick Communication - See Telephone Encounter >> Feb 23, 2018  9:57 AM Luanna Coleawoud, Jessica L wrote: CRM for notification. See Telephone encounter for: 02/23/18. Alinda Moneyony from [facility] is calling for prior authorization for  testosterone (TESTIM) 50 MG/5GM (1%) GEL [phone]#[id number]

## 2018-03-22 ENCOUNTER — Ambulatory Visit: Payer: 59 | Admitting: Nurse Practitioner

## 2018-03-22 ENCOUNTER — Encounter: Payer: Self-pay | Admitting: Nurse Practitioner

## 2018-03-22 VITALS — BP 126/86 | HR 93 | Temp 98.2°F | Ht 70.0 in | Wt 185.0 lb

## 2018-03-22 DIAGNOSIS — S0502XA Injury of conjunctiva and corneal abrasion without foreign body, left eye, initial encounter: Secondary | ICD-10-CM | POA: Diagnosis not present

## 2018-03-22 MED ORDER — NEOMYCIN-POLYMYXIN-GRAMICIDIN 1.75-10000-.025 OP SOLN: 1.0000 [drp] | Freq: Four times a day (QID) | OPHTHALMIC | 0 refills | Status: AC

## 2018-03-22 NOTE — Progress Notes (Signed)
Subjective:  Patient ID: Caleb Cobb, male    DOB: 09/25/1970  Age: 47 y.o. MRN: [id number]  CC: Eye Pain (left eye pain,red,itchy. this happened today. patient tried to flush it at home. )   Eye Problem   The left eye is affected. This is a new problem. The current episode started today. The problem occurs constantly. The problem has been gradually worsening. The injury mechanism is unknown. There is no known exposure to pink eye. He does not wear contacts. Associated symptoms include eye redness, a foreign body sensation and photophobia. Pertinent negatives include no blurred vision, eye discharge, double vision, fever, itching, nausea, recent URI or vomiting. He has tried water for the symptoms. The treatment provided no relief.  works in [employer]. Use of protective eye wear at all times. Up to date with tetanus.  Reviewed past Medical, Social and Family history today.  Outpatient Medications Prior to Visit  Medication Sig Dispense Refill  . albuterol (PROVENTIL HFA;VENTOLIN HFA) 108 (90 Base) MCG/ACT inhaler TAKE 2 PUFFS BY MOUTH EVERY 6 HOURS AS NEEDED 8.5 Inhaler 0  . FLUoxetine (PROZAC) 20 MG tablet Take by mouth.    . montelukast (SINGULAIR) 10 MG tablet Take 1 tablet (10 mg total) by mouth at bedtime. 30 tablet 3  . Multiple Vitamin (THERA) TABS Take by mouth.    . tamsulosin (FLOMAX) 0.4 MG CAPS capsule 0.4 mg once daily until stone passage occurs or for up to 4 weeks 30 capsule 0  . testosterone (TESTIM) 50 MG/5GM (1%) GEL Place 5 g onto the skin daily. 150 g 3  . testosterone cypionate (DEPOTESTOSTERONE CYPIONATE) 200 MG/ML injection Inject 1 mL (200 mg total) into the muscle every 14 (fourteen) days. 30 mL 0   No facility-administered medications prior to visit.     ROS See HPI  Objective:  BP 126/86   Pulse 93   Temp 98.2 F (36.8 C) (Oral)   Ht 5\' 10"  (1.778 m)   Wt 185 lb (83.9 kg)   SpO2 97%   BMI 26.54 kg/m   BP Readings from  Last 3 Encounters:  03/22/18 126/86  02/13/18 126/86  11/14/17 (!) 142/76    Wt Readings from Last 3 Encounters:  03/22/18 185 lb (83.9 kg)  02/13/18 184 lb 3.2 oz (83.6 kg)  11/14/17 189 lb 9.6 oz (86 kg)    Physical Exam  Eyes: Pupils are equal, round, and reactive to light. EOM are normal. Right eye exhibits no chemosis, no discharge, no exudate and no hordeolum. No foreign body present in the right eye. Left eye exhibits chemosis. Left eye exhibits no exudate and no hordeolum. No foreign body present in the left eye. Right conjunctiva is not injected. Right conjunctiva has no hemorrhage. Left conjunctiva is injected. Left conjunctiva has no hemorrhage.  Cardiovascular: Normal rate.  Pulmonary/Chest: Effort normal.  Vitals reviewed.   Lab Results  Component Value Date   WBC 5.5 02/13/2018   HGB 17.0 02/13/2018   HCT 50.1 02/13/2018   PLT 210.0 02/13/2018   GLUCOSE 89 02/13/2018   ALT 17 02/13/2018   AST 19 02/13/2018   NA 138 02/13/2018   K 4.5 02/13/2018   CL 104 02/13/2018   CREATININE 1.18 02/13/2018   BUN 16 02/13/2018   CO2 29 02/13/2018   PSA 0.55 02/13/2018    Ct Renal Stone Study  Result Date: 02/08/2018 CLINICAL DATA:  LEFT flank pain for a few weeks worse since Saturday, history kidney stones  EXAM: CT ABDOMEN AND PELVIS WITHOUT CONTRAST TECHNIQUE: Multidetector CT imaging of the abdomen and pelvis was performed following the standard protocol without IV contrast. Sagittal and coronal MPR images reconstructed from axial data set. Oral contrast not administered for this indication. COMPARISON:  11/18/2010 FINDINGS: Lower chest: Lung bases clear Hepatobiliary: Gallbladder and liver normal appearance Pancreas: Normal appearance Spleen: Normal appearance Adrenals/Urinary Tract: Adrenal glands normal appearance. Tiny nonobstructing calculus at inferior pole RIGHT kidney. No additional urinary tract calcification or dilatation. Kidneys, ureters, and contracted bladder  otherwise normal appearance. Tiny distal LEFT ureteral calculus at ureterovesical junction seen on previous exam no longer identified. Stomach/Bowel: Appendix not visualized but no pericecal inflammatory process is seen. Scattered stool throughout colon. Stomach distended by food debris. Small bowel loops unremarkable. Question mild wall thickening of rectum versus artifact from incomplete distention, similar to prior exam. Vascular/Lymphatic: Aorta normal caliber. Few normal sized mesenteric lymph nodes without adenopathy. Reproductive: Mild prostatic enlargement, gland 4.0 x 3.3 cm. Other: Small RIGHT inguinal hernia containing fat. No free air or free fluid. Musculoskeletal: No acute osseous findings. IMPRESSION: Tiny nonobstructing RIGHT renal calculus. Small RIGHT inguinal hernia containing fat. Question mild chronic rectal wall thickening versus artifact from underdistention. No definite acute intra-abdominal or intrapelvic abnormalities. Electronically Signed   By: Ulyses SouthwardMark  Boles M.D.   On: 02/08/2018 17:28    Assessment & Plan:   Judie GrieveBryan was seen today for eye pain.  Diagnoses and all orders for this visit:  Cornea abrasion, left, initial encounter -     neomycin-polymyxin-gramicidin (NEOSPORIN) 1.75-10000-.025 ophthalmic solution; Place 1 drop into the left eye 4 (four) times daily for 7 days.   I am having Collier FlowersBryan S. Augenstein start on neomycin-polymyxin-gramicidin. I am also having him maintain his FLUoxetine, THERA, montelukast, albuterol, tamsulosin, testosterone, and testosterone cypionate.  Meds ordered this encounter  Medications  . neomycin-polymyxin-gramicidin (NEOSPORIN) 1.75-10000-.025 ophthalmic solution    Sig: Place 1 drop into the left eye 4 (four) times daily for 7 days.    Dispense:  1 Bottle    Refill:  0    Order Specific Question:   Supervising Provider    Answer:   Clare GandySCHMITZ, JEREMY E [[id number]]    Follow-up: No follow-ups on file.  Alysia Pennaharlotte Elliott Quade, NP

## 2018-03-22 NOTE — Patient Instructions (Signed)
Go to ophthalmology if no improvement in 2days. Use cold compress every 1-2hrs x 10mins to soothe irritation. Avoid scratching, use sunglasses  Corneal Abrasion A corneal abrasion is a scratch or injury to the clear covering over the front of your eye (cornea). This can be painful. It is important to get treatment for a corneal abrasion. If this problem is not treated, it can affect your eyesight (vision). Follow these instructions at home: Medicines  Use eye drops or ointments as told by your doctor.  If you were prescribed antibiotic drops or ointment, use them as told by your doctor. Do not stop using the antibiotic even if you start to feel better.  Take over-the-counter and prescription medicines only as told by your doctor.  Do not drive or use heavy machinery while taking prescription pain medicine. General instructions  If you have an eye patch, wear it as told by your doctor. ? Do not drive or use machinery while wearing an eye patch. ? Follow instructions from your doctor about when to take off the patch.  Ask your doctor if you can use a cold, wet cloth (compress) on your eye to help with pain.  Do not rub or touch your eye. Do not wash out your eye.  Do not wear contact lenses until your doctor says that this is okay.  Avoid bright light.  Avoid straining your eyes.  Keep all follow-up visits as told by your doctor. Doing this can help to prevent infection and loss of eyesight. Contact a doctor if:  You continue to have eye pain and other symptoms for more than 2 days.  You get new symptoms, such as: ? Redness. ? Watery eyes (tearing). ? Discharge.  You have discharge that makes your eyelids stick together in the morning.  Symptoms come back after your eye heals. Get help right away if:  You have very bad eye pain that does not get better with medicine.  You lose eyesight. Summary  A corneal abrasion is a scratch or injury to the clear covering over the  front of your eye (cornea).  It is important to get treatment for a corneal abrasion. If this problem is not treated, it can affect your eyesight (vision).  Use eye drops or ointments as told by your doctor.  If you have an eye patch, do not drive or use machinery while wearing it. This information is not intended to replace advice given to you by your health care provider. Make sure you discuss any questions you have with your health care provider. Document Released: 01/12/2008 Document Revised: 07/10/2016 Document Reviewed: 07/10/2016 Elsevier Interactive Patient Education  2017 [employer]

## 2018-08-23 ENCOUNTER — Other Ambulatory Visit: Payer: Self-pay | Admitting: Family Medicine

## 2018-09-12 ENCOUNTER — Other Ambulatory Visit: Payer: Self-pay | Admitting: Family Medicine

## 2018-09-12 MED ORDER — HYDROCODONE-HOMATROPINE 5-1.5 MG/5ML PO SYRP: 5.0000 mL | ORAL_SOLUTION | Freq: Three times a day (TID) | ORAL | 0 refills | Status: AC | PRN

## 2018-09-12 NOTE — Progress Notes (Signed)
Diagnosed with flu.  Wife asks he get rx for hycodan which is appropriate. eRx sent.

## 2018-09-18 IMAGING — DX DG ABDOMEN 2V
4 series · 4 of 4 positions shown · non-contrast
Comparison: 11/18/2010

CLINICAL DATA: Cough with exercise.  Abdominal pain.

EXAM:
ABDOMEN - 2 VIEW

[abdomen supine ap]
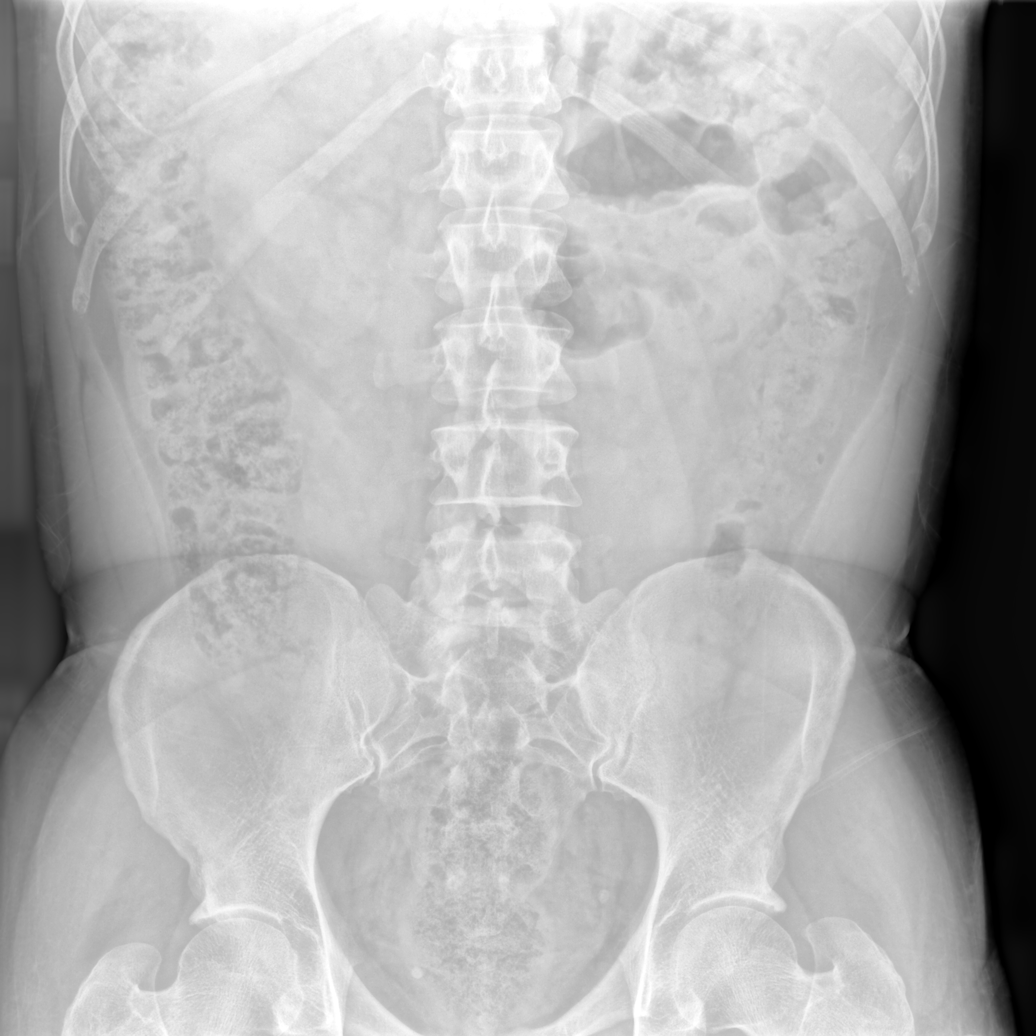

[abdomen standing ap (1 of 2)]
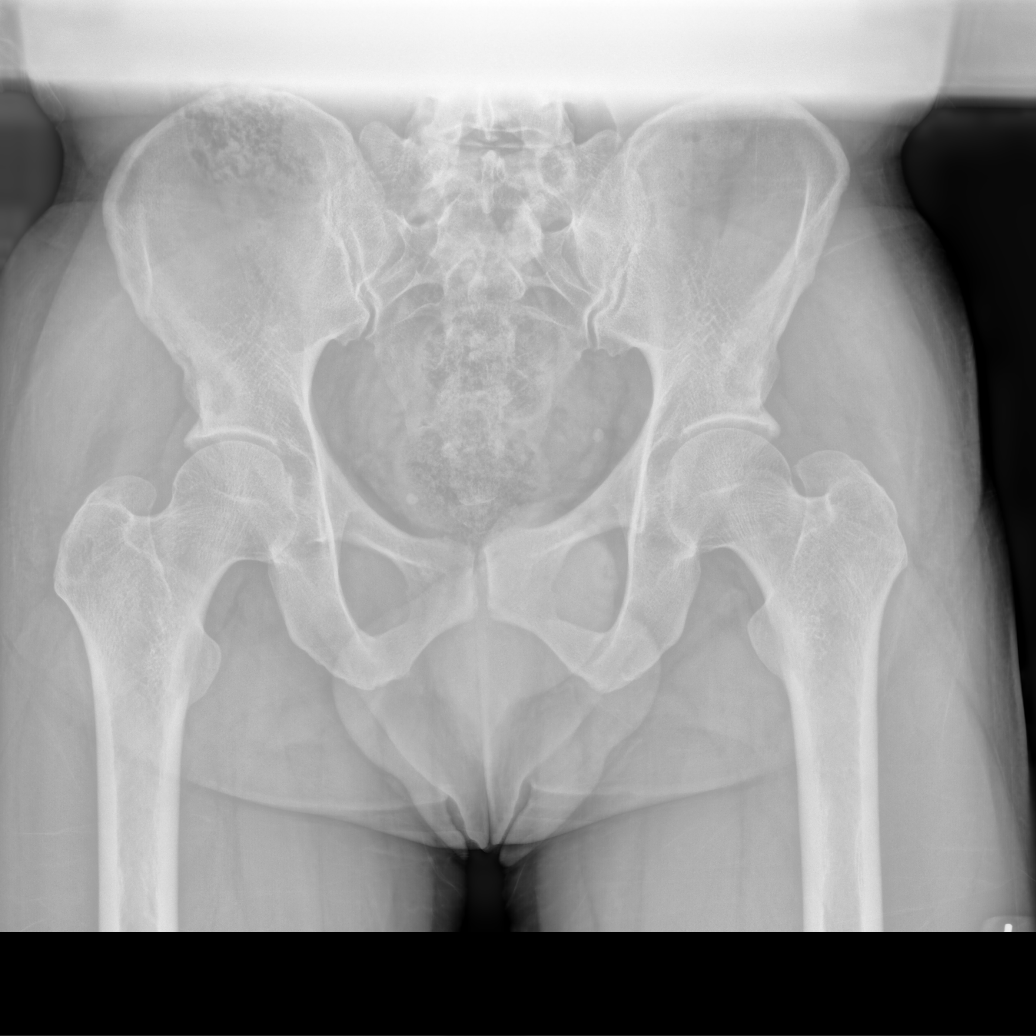

[abdomen standing ap (2 of 2)]
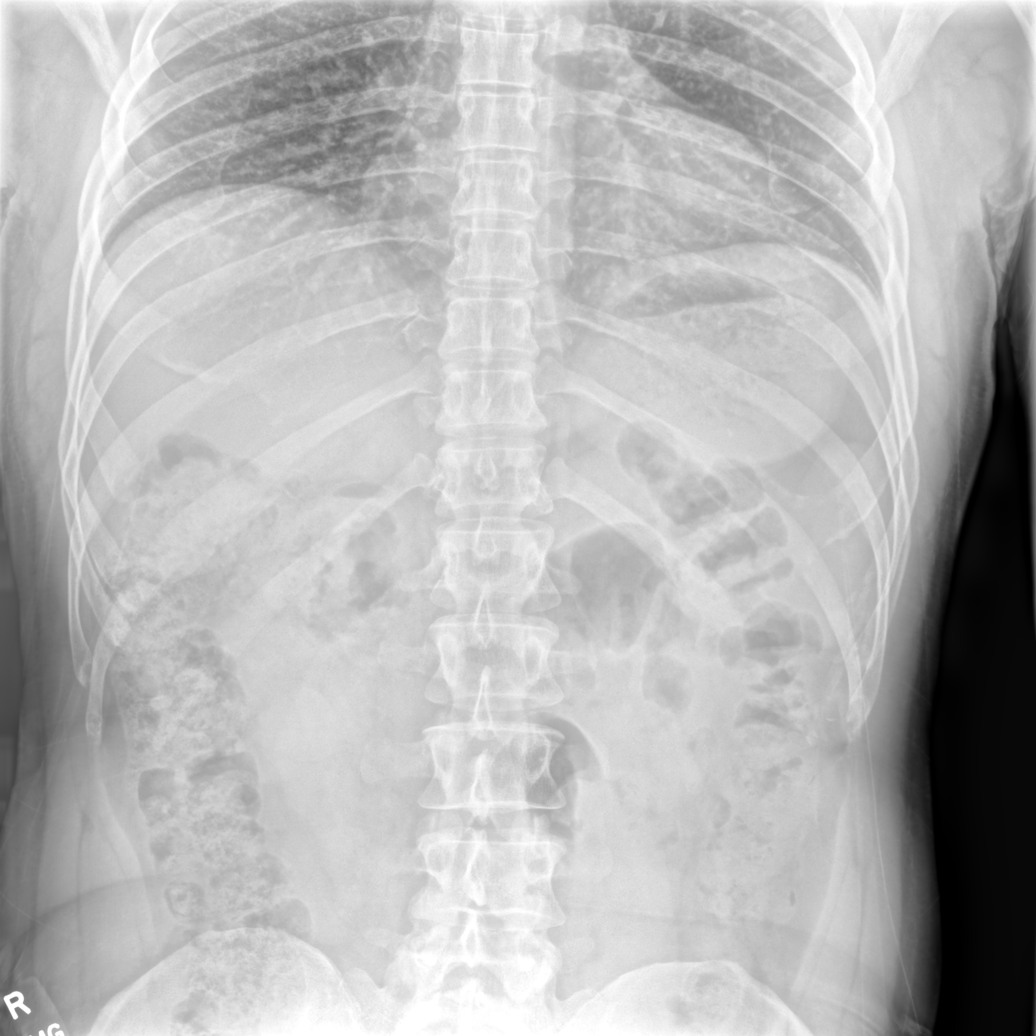

[chest pa]
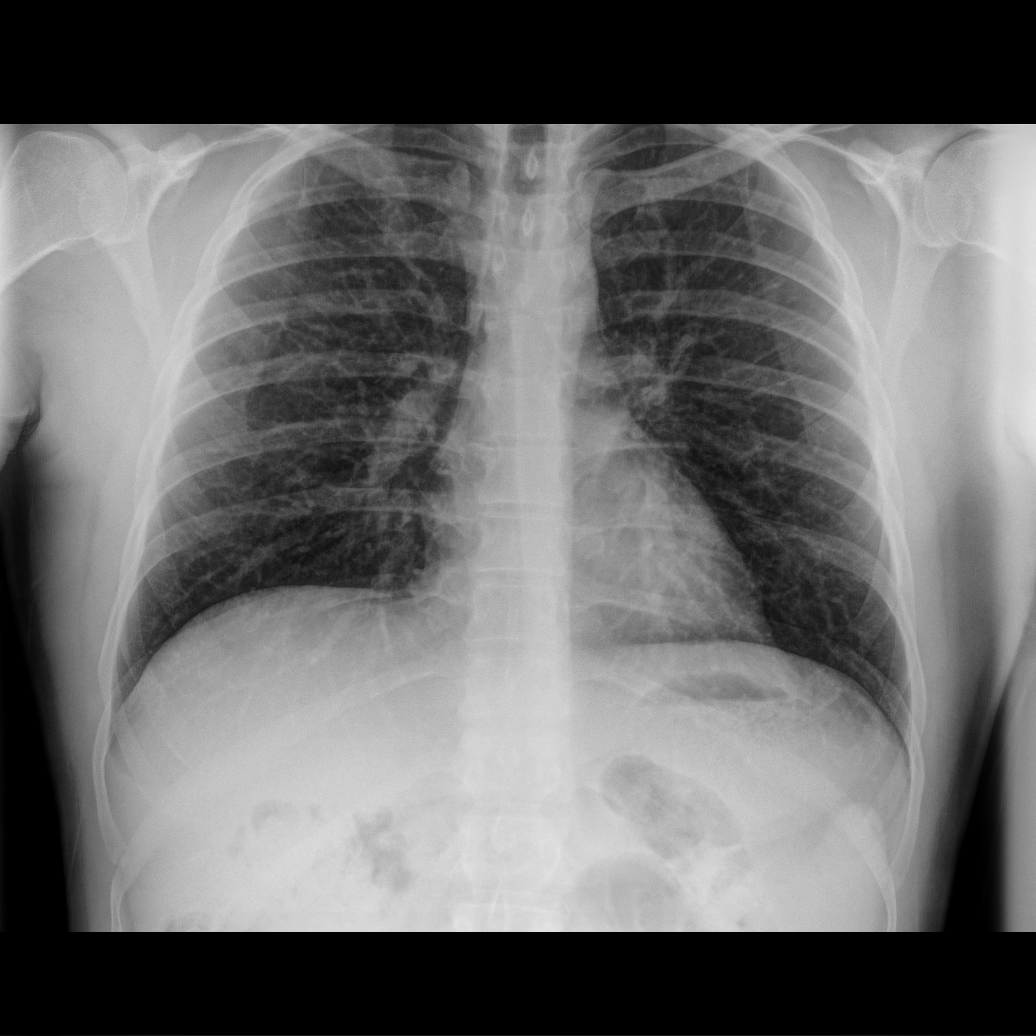

[4 of 4 positions shown; findings below may reference images not displayed]

FINDINGS: Normal heart size. No pleural effusion or edema. The lungs are
clear. The bowel gas pattern is nonobstructive. Moderate stool
burden noted within the colon and rectum.
IMPRESSION: 1. Nonobstructive bowel gas pattern.
2. No active cardiopulmonary abnormalities.

## 2018-10-27 ENCOUNTER — Other Ambulatory Visit: Payer: Self-pay | Admitting: Family Medicine

## 2018-10-27 ENCOUNTER — Telehealth: Payer: Self-pay | Admitting: Family Medicine

## 2018-10-27 MED ORDER — DIAZEPAM 10 MG PO TABS: 10.0000 mg | ORAL_TABLET | Freq: Three times a day (TID) | ORAL | 1 refills | Status: DC | PRN

## 2018-10-27 NOTE — Telephone Encounter (Signed)
Increased anxiety with pandemic and job insecurity.  Discussed treatment options.  Has done well with valium in past.  eRX sent to be used for panic attacks only. PDMP reviewed.

## 2018-10-30 ENCOUNTER — Other Ambulatory Visit: Payer: Self-pay | Admitting: Family Medicine

## 2018-10-30 MED ORDER — AZITHROMYCIN 250 MG PO TABS: ORAL_TABLET | ORAL | 1 refills | Status: AC

## 2018-11-02 NOTE — Progress Notes (Signed)
Hi Dr. Dayton Martes,  Can you try to resend the Diazepam Rx? The transmission failed to the pharmacy.  Thanks!

## 2019-01-18 ENCOUNTER — Other Ambulatory Visit: Payer: Self-pay | Admitting: Family Medicine

## 2019-01-18 MED ORDER — DIAZEPAM 10 MG PO TABS: 10.0000 mg | ORAL_TABLET | Freq: Three times a day (TID) | ORAL | 1 refills | Status: DC | PRN

## 2019-01-19 ENCOUNTER — Other Ambulatory Visit: Payer: Self-pay | Admitting: Family Medicine

## 2019-01-19 MED ORDER — SILDENAFIL CITRATE 50 MG PO TABS: 50.0000 mg | ORAL_TABLET | Freq: Every day | ORAL | 0 refills | Status: AC | PRN

## 2019-02-19 ENCOUNTER — Other Ambulatory Visit: Payer: Self-pay | Admitting: Family Medicine

## 2019-02-19 ENCOUNTER — Ambulatory Visit: Payer: 59 | Admitting: Family Medicine

## 2019-02-19 NOTE — Progress Notes (Unsigned)
Please order covid 19 testing for pt- two possible exposures.  Needs test to return to work.

## 2019-02-20 ENCOUNTER — Other Ambulatory Visit: Payer: Self-pay | Admitting: Family Medicine

## 2019-02-20 MED ORDER — DIAZEPAM 10 MG PO TABS: 10.0000 mg | ORAL_TABLET | Freq: Three times a day (TID) | ORAL | 1 refills | Status: DC | PRN

## 2019-03-22 ENCOUNTER — Other Ambulatory Visit: Payer: Self-pay | Admitting: Family Medicine

## 2019-03-22 MED ORDER — DIAZEPAM 10 MG PO TABS: 10.0000 mg | ORAL_TABLET | Freq: Three times a day (TID) | ORAL | 1 refills | Status: DC | PRN

## 2019-07-10 ENCOUNTER — Other Ambulatory Visit: Payer: Self-pay | Admitting: Family Medicine

## 2019-07-10 MED ORDER — DIAZEPAM 10 MG PO TABS: 10.0000 mg | ORAL_TABLET | Freq: Three times a day (TID) | ORAL | 1 refills | Status: DC | PRN

## 2019-07-10 MED ORDER — CLINDAMYCIN HCL 300 MG PO CAPS: 300.0000 mg | ORAL_CAPSULE | Freq: Three times a day (TID) | ORAL | 0 refills | Status: AC

## 2019-09-06 ENCOUNTER — Other Ambulatory Visit: Payer: Self-pay | Admitting: Family Medicine

## 2019-09-06 MED ORDER — DIAZEPAM 10 MG PO TABS: 10.0000 mg | ORAL_TABLET | Freq: Three times a day (TID) | ORAL | 1 refills | Status: AC | PRN

## 2019-09-06 MED ORDER — PREDNISONE 20 MG PO TABS: ORAL_TABLET | ORAL | 0 refills | Status: AC

## 2023-07-06 ENCOUNTER — Emergency Department (HOSPITAL_BASED_OUTPATIENT_CLINIC_OR_DEPARTMENT_OTHER)
Admission: EM | Admit: 2023-07-06 | Discharge: 2023-07-06 | Disposition: A | Discharge status: Home / Self Care | Payer: BC Managed Care – PPO

## 2023-07-06 ENCOUNTER — Other Ambulatory Visit (HOSPITAL_BASED_OUTPATIENT_CLINIC_OR_DEPARTMENT_OTHER): Payer: Self-pay

## 2023-07-06 ENCOUNTER — Emergency Department (HOSPITAL_BASED_OUTPATIENT_CLINIC_OR_DEPARTMENT_OTHER): Payer: BC Managed Care – PPO

## 2023-07-06 ENCOUNTER — Encounter (HOSPITAL_BASED_OUTPATIENT_CLINIC_OR_DEPARTMENT_OTHER): Payer: Self-pay

## 2023-07-06 ENCOUNTER — Other Ambulatory Visit: Payer: Self-pay

## 2023-07-06 DIAGNOSIS — N132 Hydronephrosis with renal and ureteral calculous obstruction: Secondary | ICD-10-CM | POA: Insufficient documentation

## 2023-07-06 DIAGNOSIS — R1084 Generalized abdominal pain: Secondary | ICD-10-CM | POA: Diagnosis present

## 2023-07-06 DIAGNOSIS — N23 Unspecified renal colic: Secondary | ICD-10-CM

## 2023-07-06 LAB — URINALYSIS, W/ REFLEX TO CULTURE (INFECTION SUSPECTED)
Glucose, UA: NEGATIVE mg/dL
Ketones, ur: NEGATIVE mg/dL
Leukocytes,Ua: NEGATIVE
Nitrite: NEGATIVE
Protein, ur: 30 mg/dL — AB
Specific Gravity, Urine: >=1.03 (ref 1.005–1.030)
pH: 5 (ref 5.0–8.0)

## 2023-07-06 LAB — BASIC METABOLIC PANEL
Anion gap: 6 (ref 5–15)
BUN: 15 mg/dL (ref 6–20)
CO2: 29 mmol/L (ref 22–32)
Calcium: 9.5 mg/dL (ref 8.9–10.3)
Chloride: 105 mmol/L (ref 98–111)
Creatinine, Ser: 1.34 mg/dL — ABNORMAL HIGH (ref 0.61–1.24)
GFR, Estimated: >60 mL/min (ref >60)
Glucose, Bld: 134 mg/dL — ABNORMAL HIGH (ref 70–99)
Potassium: 4 mmol/L (ref 3.5–5.1)
Sodium: 140 mmol/L (ref 135–145)

## 2023-07-06 LAB — CBC
HCT: 46.7 % (ref 39.0–52.0)
Hemoglobin: 15.5 g/dL (ref 13.0–17.0)
MCH: 28.2 pg (ref 26.0–34.0)
MCHC: 33.2 g/dL (ref 30.0–36.0)
MCV: 85.1 fL (ref 80.0–100.0)
Platelets: 199 10*3/uL (ref 150–400)
RBC: 5.49 MIL/uL (ref 4.22–5.81)
RDW: 13 % (ref 11.5–15.5)
WBC: 9.9 10*3/uL (ref 4.0–10.5)
nRBC: 0 % (ref 0.0–0.2)

## 2023-07-06 MED ORDER — ONDANSETRON HCL 4 MG/2ML IJ SOLN: 4.0000 mg | Freq: Once | INTRAMUSCULAR | Status: AC

## 2023-07-06 MED ORDER — HYDROCODONE-ACETAMINOPHEN 5-325 MG PO TABS
1.0000 | ORAL_TABLET | Freq: Four times a day (QID) | ORAL | 0 refills | Status: AC | PRN
  Filled 2023-07-06 (×2): qty 6, 2d supply, fill #0

## 2023-07-06 MED ORDER — FENTANYL CITRATE PF 50 MCG/ML IJ SOSY: 50.0000 ug | PREFILLED_SYRINGE | INTRAMUSCULAR | Status: DC | PRN

## 2023-07-06 MED ORDER — ONDANSETRON 4 MG PO TBDP
4.0000 mg | ORAL_TABLET | Freq: Three times a day (TID) | ORAL | 0 refills | Status: AC | PRN
  Filled 2023-07-06 (×2): qty 10, 4d supply, fill #0

## 2023-07-06 MED ORDER — HYDROMORPHONE HCL 1 MG/ML IJ SOLN
0.5000 mg | Freq: Once | INTRAMUSCULAR | Status: AC
  Administered 2023-07-06: 0.5 mg via INTRAVENOUS
  Filled 2023-07-06: qty 1

## 2023-07-06 MED ORDER — ONDANSETRON HCL 4 MG/2ML IJ SOLN
INTRAMUSCULAR | Status: AC
  Administered 2023-07-06: 4 mg via INTRAVENOUS
  Filled 2023-07-06: qty 2

## 2023-07-06 MED ORDER — FENTANYL CITRATE PF 50 MCG/ML IJ SOSY
PREFILLED_SYRINGE | INTRAMUSCULAR | Status: AC
  Administered 2023-07-06: 50 ug via INTRAVENOUS
  Filled 2023-07-06: qty 1

## 2023-07-06 MED ORDER — KETOROLAC TROMETHAMINE 15 MG/ML IJ SOLN
15.0000 mg | Freq: Once | INTRAMUSCULAR | Status: AC
  Administered 2023-07-06: 15 mg via INTRAVENOUS
  Filled 2023-07-06: qty 1

## 2023-07-06 NOTE — ED Notes (Signed)
Patient transported to CT 

## 2023-07-06 NOTE — ED Notes (Addendum)
Error

## 2023-07-06 NOTE — ED Notes (Signed)
Reviewed discharge instructions, medications and recommendations with pt.   Pt states understanding. Pts pain resolved and ambulatory at discharge

## 2023-07-06 NOTE — ED Triage Notes (Signed)
Pt reports lower back and stomach pain yesterday, during night pain increase left flank to left lower abdomen. Hx of kidney stones

## 2023-07-06 NOTE — ED Provider Notes (Signed)
[facility] Provider Note   CSN: [id number] Arrival date & time: 07/06/23  1257     History  Chief Complaint  Patient presents with   Flank Pain    Caleb Cobb is a 52 y.o. male.  Patient with history of appendectomy presents to the emergency department for evaluation of left-sided flank and back pain.  Patient had more generalized abdominal pain and back pain yesterday that was less severe.  He developed focal pain in the left side this morning.  No vomiting.  No stool changes.  No fevers.  He has a history of kidney stone as well and states that this feels similar.  No dysuria or hematuria.       Home Medications Prior to Admission medications   Medication Sig Start Date End Date Taking? Authorizing Provider  albuterol (PROVENTIL HFA;VENTOLIN HFA) 108 (90 Base) MCG/ACT inhaler TAKE 2 PUFFS BY MOUTH EVERY 6 HOURS AS NEEDED 12/12/17   Dianne Dun, MD  azithromycin (ZITHROMAX) 250 MG tablet 2 tabs by mouth on day 1 followed by 1 tab by mouth days 2- 5 10/30/18   Dianne Dun, MD  clindamycin (CLEOCIN) 300 MG capsule Take 1 capsule (300 mg total) by mouth 3 (three) times daily. 07/10/19   Dianne Dun, MD  diazepam (VALIUM) 10 MG tablet Take 1 tablet (10 mg total) by mouth every 8 (eight) hours as needed for anxiety. 09/06/19   Dianne Dun, MD  FLUoxetine (PROZAC) 20 MG tablet Take by mouth. 07/08/17   [provider]  HYDROcodone-homatropine (HYCODAN) 5-1.5 MG/5ML syrup Take 5 mLs by mouth every 8 (eight) hours as needed for cough. 09/12/18   Dianne Dun, MD  montelukast (SINGULAIR) 10 MG tablet Take 1 tablet (10 mg total) by mouth at bedtime. 11/14/17   Dianne Dun, MD  Multiple Vitamin (THERA) TABS Take by mouth.    [provider]  predniSONE (DELTASONE) 20 MG tablet Use as directed per PCP. 09/06/19   Dianne Dun, MD  sildenafil (VIAGRA) 50 MG tablet Take 1 tablet (50 mg total) by mouth daily as needed for  erectile dysfunction. 01/19/19   Dianne Dun, MD  tamsulosin [facility]) 0.4 MG CAPS capsule 0.4 mg once daily until stone passage occurs or for up to 4 weeks 02/08/18   Dianne Dun, MD      Allergies    Demerol  [meperidine hcl]    Review of Systems   Review of Systems  Physical Exam Updated Vital Signs BP 135/84   Pulse 85   Temp 97.8 F (36.6 C)   Resp 18   Ht 5\' 10"  (1.778 m)   Wt 86.2 kg   SpO2 99%   BMI 27.26 kg/m  Physical Exam Vitals and nursing note reviewed.  Constitutional:      General: He is not in acute distress.    Appearance: He is well-developed.  HENT:     Head: Normocephalic and atraumatic.  Eyes:     General:        Right eye: No discharge.        Left eye: No discharge.     Conjunctiva/sclera: Conjunctivae normal.  Cardiovascular:     Rate and Rhythm: Normal rate and regular rhythm.     Heart sounds: Normal heart sounds.  Pulmonary:     Effort: Pulmonary effort is normal.     Breath sounds: Normal breath sounds.  Abdominal:  Palpations: Abdomen is soft.     Tenderness: There is abdominal tenderness. There is no guarding or rebound.     Comments: Mild to moderate left lateral and left upper quadrant tenderness to palpation without rebound or guarding  Musculoskeletal:     Cervical back: Normal range of motion and neck supple.  Skin:    General: Skin is warm and dry.  Neurological:     Mental Status: He is alert.     ED Results / Procedures / Treatments   Labs (all labs ordered are listed, but only abnormal results are displayed) Labs Reviewed  BASIC METABOLIC PANEL - Abnormal; Notable for the following components:      Result Value   Glucose, Bld 134 (*)    Creatinine, Ser 1.34 (*)    All other components within normal limits  URINALYSIS, W/ REFLEX TO CULTURE (INFECTION SUSPECTED) - Abnormal; Notable for the following components:   Hgb urine dipstick MODERATE (*)    Bilirubin Urine SMALL (*)    Protein, ur 30 (*)    Bacteria, UA  FEW (*)    All other components within normal limits  CBC    EKG None  Radiology CT Renal Stone Study  Result Date: 07/06/2023 CLINICAL DATA:  Left flank and back pain beginning yesterday. Nephrolithiasis. EXAM: CT ABDOMEN AND PELVIS WITHOUT CONTRAST TECHNIQUE: Multidetector CT imaging of the abdomen and pelvis was performed following the standard protocol without IV contrast. RADIATION DOSE REDUCTION: This exam was performed according to the departmental dose-optimization program which includes automated exposure control, adjustment of the mA and/or kV according to patient size and/or use of iterative reconstruction technique. COMPARISON:  02/08/2018 FINDINGS: Lower chest: No acute findings. Hepatobiliary: No mass visualized on this unenhanced exam. Gallbladder is unremarkable. No evidence of biliary ductal dilatation. Pancreas: No mass or inflammatory process visualized on this unenhanced exam. Spleen:  Within normal limits in size. Adrenals/Urinary tract: Mild left hydroureteronephrosis and perinephric stranding is seen. Left ureteral dilatation is seen to the level of the bladder where there is a 1 mm calculus at the left UVJ. Stomach/Bowel: No evidence of obstruction, inflammatory process, or abnormal fluid collections. Vascular/Lymphatic: No pathologically enlarged lymph nodes identified. No evidence of abdominal aortic aneurysm. Reproductive:  No mass or other significant abnormality. Other:  None. Musculoskeletal:  No suspicious bone lesions identified. IMPRESSION: Mild left hydroureteronephrosis and perinephric stranding, due to 1 mm calculus at the left UVJ. Electronically Signed   By: Danae Orleans M.D.   On: 07/06/2023 15:38    Procedures Procedures    Medications Ordered in ED Medications  fentaNYL (SUBLIMAZE) injection 50 mcg (50 mcg Intravenous Given 07/06/23 1355)  ondansetron (ZOFRAN) injection 4 mg (4 mg Intravenous Given 07/06/23 1350)  HYDROmorphone (DILAUDID) injection 0.5  mg (0.5 mg Intravenous Given 07/06/23 1414)  ketorolac (TORADOL) 15 MG/ML injection 15 mg (15 mg Intravenous Given 07/06/23 1548)    ED Course/ Medical Decision Making/ A&P    Patient seen and examined. History obtained directly from patient.   Labs/EKG: Ordered CBC, BMP, UA  Imaging: Ordered CT renal protocol  Medications/Fluids: Ordered: IV Dilaudid.  Patient did receive fentanyl on arrival and pain improved but recurring..   Most recent vital signs reviewed and are as follows: BP 135/84   Pulse 85   Temp 97.8 F (36.6 C)   Resp 18   Ht 5\' 10"  (1.778 m)   Wt 86.2 kg   SpO2 99%   BMI 27.26 kg/m   Initial impression: Right-sided  abdominal and flank pain, concern for ureteral colic.  3:59 PM Reassessment performed. Patient appears stable, comfortable.  Pain improved.  Labs personally reviewed and interpreted including: CBC unremarkable; BMP with minimally elevated creatinine at 1.34 with BUN of 15, glucose 134; UA with microscopic hematuria, no compelling evidence for infection.  Imaging personally visualized and interpreted including: CT scan of the abdomen and pelvis, agree 1 mm left sided UVJ stone.  No other acute findings.  Reviewed pertinent lab work and imaging with patient at bedside.  Reviewed imaging with patient at bedside.  We did discuss minimally elevated creatinine, need for good hydration.  Questions answered.   Most current vital signs reviewed and are as follows: BP 135/84   Pulse 85   Temp 97.8 F (36.6 C)   Resp 18   Ht 5\' 10"  (1.778 m)   Wt 86.2 kg   SpO2 99%   BMI 27.26 kg/m   Plan: Discharge to home.   Prescriptions written for: Hydrocodone # 6 tablets, Zofran  Patient counseled on kidney stone treatment. Urged patient to strain urine and save any stones. Urged urology follow-up and return to [facility] with any complications. Counseled patient to maintain good fluid intake.   Patient counseled on use of narcotic pain medications. Counseled not to  combine these medications with others containing tylenol. Urged not to drink alcohol, drive, or perform any other activities that requires focus while taking these medications. The patient verbalizes understanding and agrees with the plan.                                   Medical Decision Making Amount and/or Complexity of Data Reviewed Labs: ordered. Radiology: ordered.  Risk Prescription drug management.   For this patient's complaint of abdominal pain, the following conditions were considered on the differential diagnosis: gastritis/PUD, enteritis/duodenitis, appendicitis, cholelithiasis/cholecystitis, cholangitis, pancreatitis, ruptured viscus, colitis, diverticulitis, small/large bowel obstruction, proctitis, cystitis, pyelonephritis, ureteral colic, aortic dissection, aortic aneurysm. Atypical chest etiologies were also considered including ACS, PE, and pneumonia.  Workup is reassuring, CT scan consistent with patient's symptoms demonstrating a 1 mm UVJ stone with obstructive findings, UA with microscopic hematuria.  No indications for urologic consultation as patient symptoms are controlled without other complications.  The patient's vital signs, pertinent lab work and imaging were reviewed and interpreted as discussed in the ED course. Hospitalization was considered for further testing, treatments, or serial exams/observation. However as patient is well-appearing, has a stable exam, and reassuring studies today, I do not feel that they warrant admission at this time. This plan was discussed with the patient who verbalizes agreement and comfort with this plan and seems reliable and able to return to the Emergency Department with worsening or changing symptoms.          Final Clinical Impression(s) / ED Diagnoses Final diagnoses:  Ureteral colic    Rx / DC Orders ED Discharge Orders          Ordered    HYDROcodone-acetaminophen (NORCO/VICODIN) 5-325 MG tablet  Every 6  hours PRN        07/06/23 1555    ondansetron (ZOFRAN-ODT) 4 MG disintegrating tablet  Every 8 hours PRN        07/06/23 1555              Renne Crigler, PA-C 07/06/23 1602    Durwin Glaze, MD 07/07/23 [id number]

## 2023-07-06 NOTE — ED Notes (Signed)
ED Provider at bedside. 

## 2023-07-06 NOTE — Discharge Instructions (Signed)
Please read and follow all provided instructions.  Your diagnoses today include:  1. Ureteral colic     Tests performed today include: Urine test that showed blood in your urine and no infection CT scan which showed a 1 millimeter kidney stone on the left side Blood test that showed slightly weak kidney function Vital signs. See below for your results today.   Medications prescribed:  Vicodin (hydrocodone/acetaminophen) - narcotic pain medication  DO NOT drive or perform any activities that require you to be awake and alert because this medicine can make you drowsy. BE VERY CAREFUL not to take multiple medicines containing Tylenol (also called acetaminophen). Doing so can lead to an overdose which can damage your liver and cause liver failure and possibly death.  Zofran (ondansetron) - for nausea and vomiting  Take any prescribed medications only as directed.  Home care instructions:  Follow any educational materials contained in this packet.  Please double your fluid intake for the next several days. Strain your urine and save any stones that may pass.   BE VERY CAREFUL not to take multiple medicines containing Tylenol (also called acetaminophen). Doing so can lead to an overdose which can damage your liver and cause liver failure and possibly death.   Follow-up instructions: Please follow-up with your urologist or the urologist referral (provided on front page) in the next 1 week for further evaluation of your symptoms.  Return instructions:  Please return to the Emergency Department if you experience worsening symptoms.  Please return if you develop fever or uncontrolled pain or vomiting. Please return if you have any other emergent concerns.  Additional Information:  Your vital signs today were: BP 135/84   Pulse 85   Temp 97.8 F (36.6 C)   Resp 18   Ht 5\' 10"  (1.778 m)   Wt 86.2 kg   SpO2 99%   BMI 27.26 kg/m  If your blood pressure (BP) was elevated above  135/85 this visit, please have this repeated by your doctor within one month. --------------
